# Patient Record
Sex: Male | Born: 1945 | Race: White | Hispanic: No | Marital: Married | Smoking: Never smoker
Health system: Southern US, Community
[De-identification: ages and names within clinical notes are randomized; demographics above are authoritative.]

## PROBLEM LIST (undated history)

## (undated) DIAGNOSIS — E7800 Pure hypercholesterolemia, unspecified: Secondary | ICD-10-CM

## (undated) DIAGNOSIS — I4891 Unspecified atrial fibrillation: Secondary | ICD-10-CM

## (undated) DIAGNOSIS — I10 Essential (primary) hypertension: Secondary | ICD-10-CM

## (undated) HISTORY — PX: REPLACEMENT TOTAL KNEE BILATERAL: SUR1225

---

## 2017-07-21 ENCOUNTER — Emergency Department (HOSPITAL_COMMUNITY)
Admission: EM | Admit: 2017-07-21 | Discharge: 2017-07-22 | Disposition: A | Discharge status: Home / Self Care | Payer: Medicare Other | Attending: Emergency Medicine | Admitting: Emergency Medicine

## 2017-07-21 ENCOUNTER — Emergency Department (HOSPITAL_COMMUNITY): Payer: Medicare Other

## 2017-07-21 ENCOUNTER — Encounter (HOSPITAL_COMMUNITY): Payer: Self-pay | Admitting: Emergency Medicine

## 2017-07-21 DIAGNOSIS — Y92511 Restaurant or cafe as the place of occurrence of the external cause: Secondary | ICD-10-CM | POA: Diagnosis not present

## 2017-07-21 DIAGNOSIS — Z79899 Other long term (current) drug therapy: Secondary | ICD-10-CM | POA: Diagnosis not present

## 2017-07-21 DIAGNOSIS — Z96653 Presence of artificial knee joint, bilateral: Secondary | ICD-10-CM | POA: Diagnosis not present

## 2017-07-21 DIAGNOSIS — S299XXA Unspecified injury of thorax, initial encounter: Secondary | ICD-10-CM | POA: Diagnosis present

## 2017-07-21 DIAGNOSIS — I10 Essential (primary) hypertension: Secondary | ICD-10-CM | POA: Diagnosis not present

## 2017-07-21 DIAGNOSIS — N179 Acute kidney failure, unspecified: Secondary | ICD-10-CM | POA: Insufficient documentation

## 2017-07-21 DIAGNOSIS — Y9301 Activity, walking, marching and hiking: Secondary | ICD-10-CM | POA: Insufficient documentation

## 2017-07-21 DIAGNOSIS — W108XXA Fall (on) (from) other stairs and steps, initial encounter: Secondary | ICD-10-CM | POA: Diagnosis not present

## 2017-07-21 DIAGNOSIS — S2241XA Multiple fractures of ribs, right side, initial encounter for closed fracture: Secondary | ICD-10-CM | POA: Diagnosis not present

## 2017-07-21 DIAGNOSIS — Y999 Unspecified external cause status: Secondary | ICD-10-CM | POA: Insufficient documentation

## 2017-07-21 HISTORY — DX: Pure hypercholesterolemia, unspecified: E78.00

## 2017-07-21 HISTORY — DX: Unspecified atrial fibrillation: I48.91

## 2017-07-21 HISTORY — DX: Essential (primary) hypertension: I10

## 2017-07-21 LAB — CBC WITH DIFFERENTIAL/PLATELET
BASOS PCT: 0 %
Basophils Absolute: 0 10*3/uL (ref 0.0–0.1)
EOS ABS: 0 10*3/uL (ref 0.0–0.7)
Eosinophils Relative: 0 %
HCT: 38.5 % — ABNORMAL LOW (ref 39.0–52.0)
Hemoglobin: 12.8 g/dL — ABNORMAL LOW (ref 13.0–17.0)
Lymphocytes Relative: 8 %
Lymphs Abs: 0.9 10*3/uL (ref 0.7–4.0)
MCH: 32.8 pg (ref 26.0–34.0)
MCHC: 33.2 g/dL (ref 30.0–36.0)
MCV: 98.7 fL (ref 78.0–100.0)
MONOS PCT: 12 %
Monocytes Absolute: 1.4 10*3/uL — ABNORMAL HIGH (ref 0.1–1.0)
NEUTROS PCT: 80 %
Neutro Abs: 8.9 10*3/uL — ABNORMAL HIGH (ref 1.7–7.7)
Platelets: 209 10*3/uL (ref 150–400)
RBC: 3.9 MIL/uL — ABNORMAL LOW (ref 4.22–5.81)
RDW: 13.9 % (ref 11.5–15.5)
WBC: 11.2 10*3/uL — ABNORMAL HIGH (ref 4.0–10.5)

## 2017-07-21 MED ORDER — ONDANSETRON 4 MG PO TBDP
4.0000 mg | ORAL_TABLET | Freq: Once | ORAL | Status: DC
Start: 2017-07-21 — End: 2017-07-21
  Filled 2017-07-21: qty 1

## 2017-07-21 MED ORDER — OXYCODONE HCL 5 MG PO TABS
5.0000 mg | ORAL_TABLET | Freq: Once | ORAL | Status: AC
  Administered 2017-07-21: 5 mg via ORAL
  Filled 2017-07-21: qty 1

## 2017-07-21 MED ORDER — ACETAMINOPHEN 500 MG PO TABS
1000.0000 mg | ORAL_TABLET | Freq: Once | ORAL | Status: AC
  Administered 2017-07-21: 1000 mg via ORAL
  Filled 2017-07-21: qty 2

## 2017-07-21 MED ORDER — ONDANSETRON HCL 4 MG/2ML IJ SOLN
4.0000 mg | Freq: Once | INTRAMUSCULAR | Status: AC
  Administered 2017-07-21: 4 mg via INTRAVENOUS
  Filled 2017-07-21: qty 2

## 2017-07-21 MED ORDER — MORPHINE SULFATE (PF) 4 MG/ML IV SOLN
4.0000 mg | Freq: Once | INTRAVENOUS | Status: AC
  Administered 2017-07-21: 4 mg via INTRAMUSCULAR
  Filled 2017-07-21: qty 1

## 2017-07-21 NOTE — ED Triage Notes (Signed)
PATIENT ADDS PAIN WITH RIGHT RIBS THAT IS WORSE WITH MOVEMENT OR DEEP BREATHING.

## 2017-07-21 NOTE — ED Triage Notes (Signed)
Patient brought in by [facility] from [place] where [place] Patient fell last night on uneven ground. Denies hitting his head or any LOC. Patient c/o is on Eliquis. C/o right flank pain and right knee pain. Patient has abrasion to right knee, with increased pain with movement.

## 2017-07-21 NOTE — ED Provider Notes (Signed)
[facility]-EMERGENCY DEPT Provider Note   CSN: [id number] Arrival date & time: 07/21/17  1107     History   Chief Complaint Chief Complaint  Patient presents with  . Flank Pain  . Knee Pain    right  . Chest Pain    right    HPI Kevin Mooney is a 71 y.o. male.  HPI   Presents with concern for fall with right flank pain and right knee pain.  Was walking on grass to a restaurant, stepping up a step, caught foot on it and fell. Fell onto cement on right side.  No head trauma, no headache, no neck pain, no numbness or weakness on one side or the other.  Severe pain with deep breaths on the right side, 8/10, worse with breathing. Diffculty breathing with deep breaths.  No abdominal pain, no nausea, no vomiting.  Feeling fatigued, weak.  Not lightheaded. Is on eliquis for atrial fibrillation.      Past Medical History:  Diagnosis Date  . Atrial fibrillation (HCC)   . High cholesterol   . Hypertension     There are no active problems to display for this patient.   Past Surgical History:  Procedure Laterality Date  . REPLACEMENT TOTAL KNEE BILATERAL         Home Medications    Prior to Admission medications   Medication Sig Start Date End Date Taking? Authorizing Provider  amiodarone (PACERONE) 200 MG tablet Take 200 mg by mouth 2 (two) times daily. 07/06/17  Yes [provider]  amLODipine (NORVASC) 5 MG tablet Take 5 mg by mouth daily. 05/03/17  Yes [provider]  benazepril-hydrochlorthiazide (LOTENSIN HCT) 20-25 MG tablet Take 1 tablet by mouth daily. 06/25/17  Yes [provider]  doxazosin (CARDURA) 4 MG tablet Take 4 mg by mouth 2 (two) times daily. 06/02/17  Yes [provider]  ELIQUIS 5 MG TABS tablet Take 5 mg by mouth 2 (two) times daily. 06/25/17  Yes [provider]  metoprolol succinate (TOPROL-XL) 50 MG 24 hr tablet Take 50 mg by mouth 2 (two) times daily. 05/16/17  Yes [provider]  Multiple Vitamin  (MULTIVITAMIN WITH MINERALS) TABS tablet Take 1 tablet by mouth daily.   Yes [provider]  polyvinyl alcohol (LIQUIFILM TEARS) 1.4 % ophthalmic solution Place 1 drop into both eyes as needed for dry eyes.   Yes [provider]  potassium chloride (K-DUR,KLOR-CON) 10 MEQ tablet Take 10 mEq by mouth daily. 04/13/17  Yes [provider]  pravastatin (PRAVACHOL) 40 MG tablet Take 40 mg by mouth daily. 06/25/17  Yes [provider]  sertraline (ZOLOFT) 50 MG tablet Take 50 mg by mouth daily. 05/26/17  Yes [provider]  zolpidem (AMBIEN) 10 MG tablet Take 10 mg by mouth at bedtime as needed. 07/01/17  Yes [provider]  acetaminophen (TYLENOL) 500 MG tablet Take 2 tablets (1,000 mg total) by mouth every 6 (six) hours as needed. 07/22/17   Alvira Monday, MD  oxyCODONE (ROXICODONE) 5 MG immediate release tablet Take 1 tablet (5 mg total) by mouth every 4 (four) hours as needed for severe pain. 07/22/17   Alvira Monday, MD  potassium chloride (K-DUR) 10 MEQ tablet Take 1 tablet (10 mEq total) by mouth 2 (two) times daily. 07/22/17 07/25/17  Alvira Monday, MD    Family History No family history on file.  Social History Social History  Substance Use Topics  . Smoking status: Never Smoker  .  Smokeless tobacco: Never Used  . Alcohol use No     Allergies   Patient has no known allergies.   Review of Systems Review of Systems  Constitutional: Negative for fever.  Respiratory: Positive for shortness of breath. Negative for cough.   Cardiovascular: Positive for chest pain.  Gastrointestinal: Negative for abdominal pain, nausea and vomiting.  Genitourinary: Positive for flank pain.  Musculoskeletal: Positive for arthralgias.  Neurological: Positive for light-headedness.     Physical Exam Updated Vital Signs BP (!) 155/77 (BP Location: Left Arm)   Pulse (!) 51   Temp 98.3 F (36.8 C) (Oral)   Resp 13   Ht  (1.88 m)   Wt  111.6 kg (246 lb)   SpO2 93%   BMI 31.58 kg/m   Physical Exam  Constitutional: He is oriented to person, place, and time. He appears well-developed and well-nourished. No distress.  HENT:  Head: Normocephalic and atraumatic.  Eyes: Conjunctivae and EOM are normal.  Neck: Normal range of motion.  Cardiovascular: Normal rate, regular rhythm, normal heart sounds and intact distal pulses.  Exam reveals no gallop and no friction rub.   No murmur heard. Pulmonary/Chest: Effort normal and breath sounds normal. No respiratory distress. He has no wheezes. He has no rales. He exhibits tenderness (right).  Abdominal: Soft. He exhibits no distension. There is no tenderness. There is no guarding.  Contusion left abdomen (from prior fall per family)   Musculoskeletal: He exhibits edema (2+ bilaterally).       Cervical back: He exhibits no bony tenderness.  Abrasion, tenderness right knee. Full ROM, no effusion  Neurological: He is alert and oriented to person, place, and time.  Skin: Skin is warm and dry. He is not diaphoretic.  Nursing note and vitals reviewed.    ED Treatments / Results  Labs (all labs ordered are listed, but only abnormal results are displayed) Labs Reviewed  CBC WITH DIFFERENTIAL/PLATELET - Abnormal; Notable for the following:       Result Value   WBC 11.2 (*)    RBC 3.90 (*)    Hemoglobin 12.8 (*)    HCT 38.5 (*)    Neutro Abs 8.9 (*)    Monocytes Absolute 1.4 (*)    All other components within normal limits  COMPREHENSIVE METABOLIC PANEL - Abnormal; Notable for the following:    Potassium 3.0 (*)    Glucose, Bld 116 (*)    BUN 40 (*)    Creatinine, Ser 1.76 (*)    Calcium 8.7 (*)    Total Protein 6.2 (*)    AST 66 (*)    ALT 108 (*)    GFR calc non Af Amer 37 (*)    GFR calc Af Amer 42 (*)    All other components within normal limits    EKG  EKG Interpretation  Date/Time:  Wednesday July 21 2017 21:44:15 EDT Ventricular Rate:  49 PR Interval:      QRS Duration: 116 QT Interval:  473 QTC Calculation: 427 R Axis:   -59 Text Interpretation:  Sinus bradycardia LAD, 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 EXAM: RIGHT RIBS AND CHEST -  3+ VIEW COMPARISON:  None. FINDINGS: Trachea is midline. Heart is mildly enlarged. Lungs are low in volume with streaky atelectasis in the left lower lobe. No airspace consolidation or pleural fluid. No pneumothorax. Dedicated views of the right ribs show nondisplaced fractures of the lateral aspects of the right ninth and tenth ribs. The ninth rib fracture appears to be segmental. IMPRESSION: Fractures of the right ninth and tenth ribs. Electronically Signed   By: Leanna Battles M.D.   On: 07/21/2017 12:14   Dg Knee Complete 4 Views Right  Result Date: 07/21/2017 CLINICAL DATA:  Fall with right knee pain.  Initial encounter. EXAM: RIGHT KNEE - COMPLETE 4+ VIEW COMPARISON:  None. FINDINGS: No evidence of fracture, dislocation, or joint effusion. Total knee arthroplasty that appears well seated. IMPRESSION: 1. No acute finding. 2. Well-seated total knee arthroplasty. Electronically Signed   By: Marnee Spring M.D.   On: 07/21/2017 12:12    Procedures Procedures (including critical care time)  Medications Ordered in ED Medications  morphine 4 MG/ML injection 4 mg (4 mg Intramuscular Given 07/21/17 2201)  ondansetron (ZOFRAN) injection 4 mg (4 mg Intravenous Given 07/21/17 2201)  oxyCODONE (Oxy IR/ROXICODONE) immediate release tablet 5 mg (5 mg Oral Given 07/21/17 2341)  acetaminophen (TYLENOL) tablet 1,000 mg (1,000 mg Oral Given 07/21/17 2341)   oxyCODONE (Oxy IR/ROXICODONE) immediate release tablet 5 mg (5 mg Oral Given 07/21/17 2352)  potassium chloride SA (K-DUR,KLOR-CON) CR tablet 40 mEq (40 mEq Oral Given 07/22/17 0150)     Initial Impression / Assessment and Plan / ED Course  I have reviewed the triage vital signs and the nursing notes.  Pertinent labs & imaging results that were available during my care of the patient were reviewed by me and considered in my medical decision making (see chart for details).     71yo male with history of atrial fibrillation, htn, hlpd, presents with concern for fall last night with right rib pain and right knee pain.  Denies neck pain, CSpine tenderness, neurologically intact, doubt cervical spine injury.  Discussed given pt on eliquis it is reasonable to obtain head CT, he did not hit his head, no LOC, no signs of head trauma, no headache, no nausea/vomiting, no confusion and feel it is reasonable to continue to monitor his symptoms and patient agrees. Knee xr WNL, patellar tendon function intact, likely abrasion/contusion pain.  XR of ribs shows 9th and 10th rib fractures. Discussed that CT was reasonable to assess for other injuries occult on XR, but given incident occurred last night, patient performing well on spirometry, stable vital signs, no pneumo/hemothorax on XR feel it will likely not change management and patient is stable for outpatient management with strict return precautions.    He is pulling 2000cc on IS, pain managed with oral medications.  Labs obtained given he did describe some lightheadedness, showing hgb stable for days ago. AST and ALT similar to prior and doubt acute hepatic injury from fall.  Cr noted to be increased to 1.7, previous was 1.2-1.5.  Feel this is likely secondary to dehydration, patient not eating or drinking a lot today.  Given K replacement for K of 3.  Encouraged po intake, IS, close PCP follow up.  Final Clinical Impressions(s) / ED Diagnoses   Final  diagnoses:  Closed fracture of multiple ribs of right side, initial encounter  Acute kidney injury (HCC), mild elevation in creatinine to 1.7    New Prescriptions Discharge Medication List as of 07/22/2017  1:44 AM  START taking these medications   Details  acetaminophen (TYLENOL) 500 MG tablet Take 2 tablets (1,000 mg total) by mouth every 6 (six) hours as needed., Starting Thu 07/22/2017, Print    oxyCODONE (ROXICODONE) 5 MG immediate release tablet Take 1 tablet (5 mg total) by mouth every 4 (four) hours as needed for severe pain., Starting Thu 07/22/2017, Print    potassium chloride (K-DUR) 10 MEQ tablet Take 1 tablet (10 mEq total) by mouth 2 (two) times daily., Starting Thu 07/22/2017, Until Sun 07/25/2017, Print         Alvira Monday, MD 07/22/17 1341

## 2017-07-21 NOTE — ED Notes (Signed)
Bed: WTR6 Expected date: 07/21/17 Expected time: 11:10 AM Means of arrival: Ambulance Comments: Fall, knee pain

## 2017-07-21 NOTE — ED Notes (Signed)
Dropped pain med on floor in front of pt Pain med wasted with 2nd rn  Provider notified and pain med reordered  IS given to pt 10 breathes every hr to start  Pain meds given  Pain 7 out 10

## 2017-07-22 DIAGNOSIS — S2241XA Multiple fractures of ribs, right side, initial encounter for closed fracture: Secondary | ICD-10-CM | POA: Diagnosis not present

## 2017-07-22 LAB — COMPREHENSIVE METABOLIC PANEL
ALBUMIN: 3.5 g/dL (ref 3.5–5.0)
ALT: 108 U/L — AB (ref 17–63)
AST: 66 U/L — AB (ref 15–41)
Alkaline Phosphatase: 61 U/L (ref 38–126)
Anion gap: 8 (ref 5–15)
BUN: 40 mg/dL — AB (ref 6–20)
CHLORIDE: 104 mmol/L (ref 101–111)
CO2: 30 mmol/L (ref 22–32)
Calcium: 8.7 mg/dL — ABNORMAL LOW (ref 8.9–10.3)
Creatinine, Ser: 1.76 mg/dL — ABNORMAL HIGH (ref 0.61–1.24)
GFR calc Af Amer: 42 mL/min — ABNORMAL LOW (ref >60)
GFR calc non Af Amer: 37 mL/min — ABNORMAL LOW (ref >60)
GLUCOSE: 116 mg/dL — AB (ref 65–99)
POTASSIUM: 3 mmol/L — AB (ref 3.5–5.1)
Sodium: 142 mmol/L (ref 135–145)
Total Bilirubin: 0.7 mg/dL (ref 0.3–1.2)
Total Protein: 6.2 g/dL — ABNORMAL LOW (ref 6.5–8.1)

## 2017-07-22 MED ORDER — POTASSIUM CHLORIDE ER 10 MEQ PO TBCR: 10.0000 meq | EXTENDED_RELEASE_TABLET | Freq: Two times a day (BID) | ORAL | 0 refills | Status: AC

## 2017-07-22 MED ORDER — ACETAMINOPHEN 500 MG PO TABS: 1000.0000 mg | ORAL_TABLET | Freq: Four times a day (QID) | ORAL | 0 refills | Status: AC | PRN

## 2017-07-22 MED ORDER — POTASSIUM CHLORIDE CRYS ER 20 MEQ PO TBCR
40.0000 meq | EXTENDED_RELEASE_TABLET | Freq: Once | ORAL | Status: AC
  Administered 2017-07-22: 40 meq via ORAL
  Filled 2017-07-22: qty 2

## 2017-07-22 MED ORDER — OXYCODONE HCL 5 MG PO TABS: 5.0000 mg | ORAL_TABLET | ORAL | 0 refills | Status: AC | PRN

## 2018-06-13 IMAGING — CR DG RIBS W/ CHEST 3+V*R*
5 series · 5 of 5 positions shown · non-contrast
Comparison: None.

CLINICAL DATA: Fall, right flank and rib pain, initial encounter.

EXAM:
RIGHT RIBS AND CHEST - 3+ VIEW

[w chest pa]
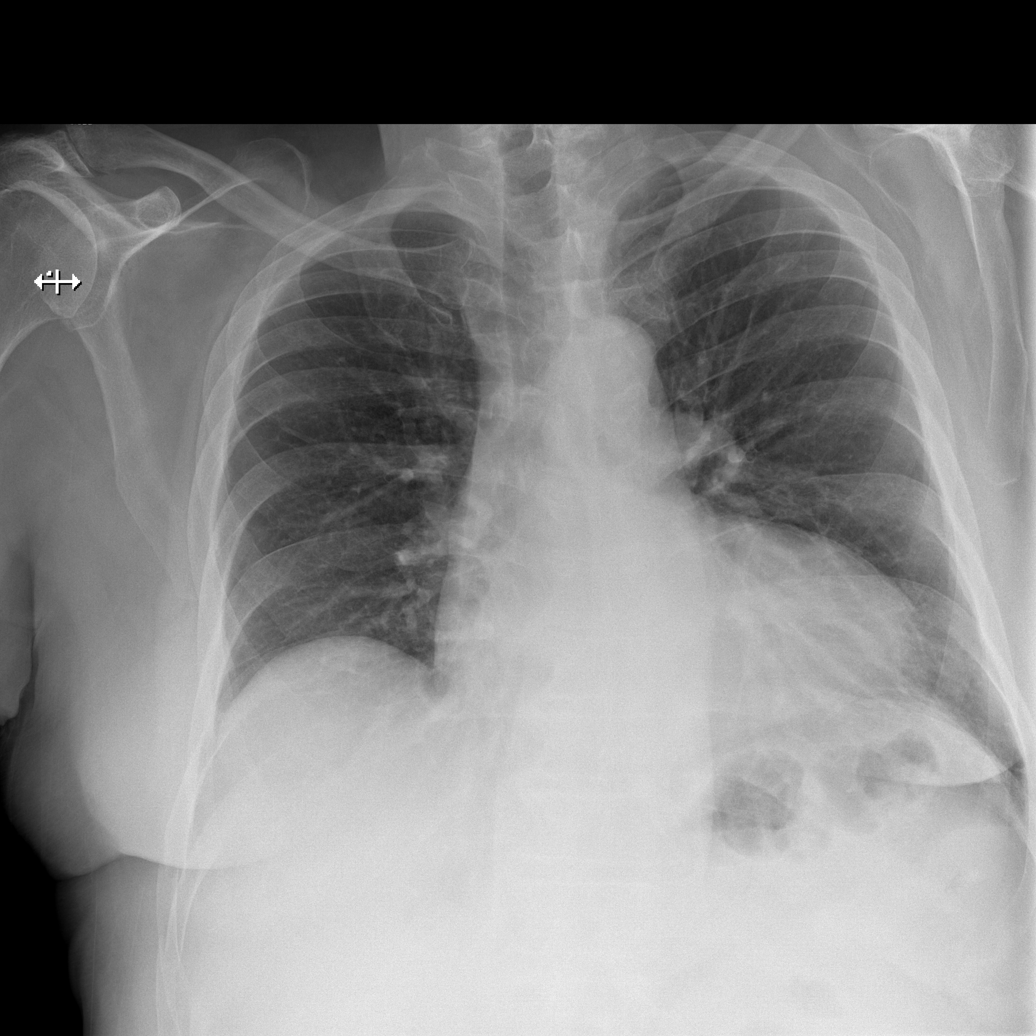

[w ribs ap upper right]
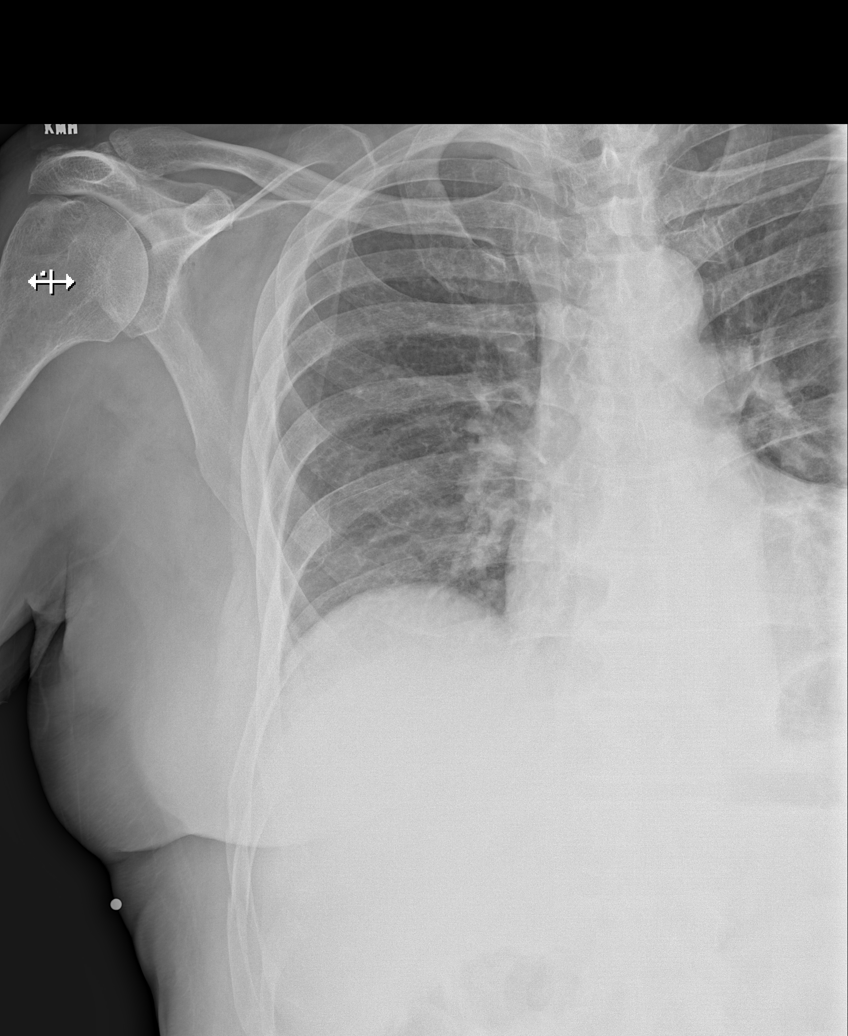

[w ribs ap lower right]
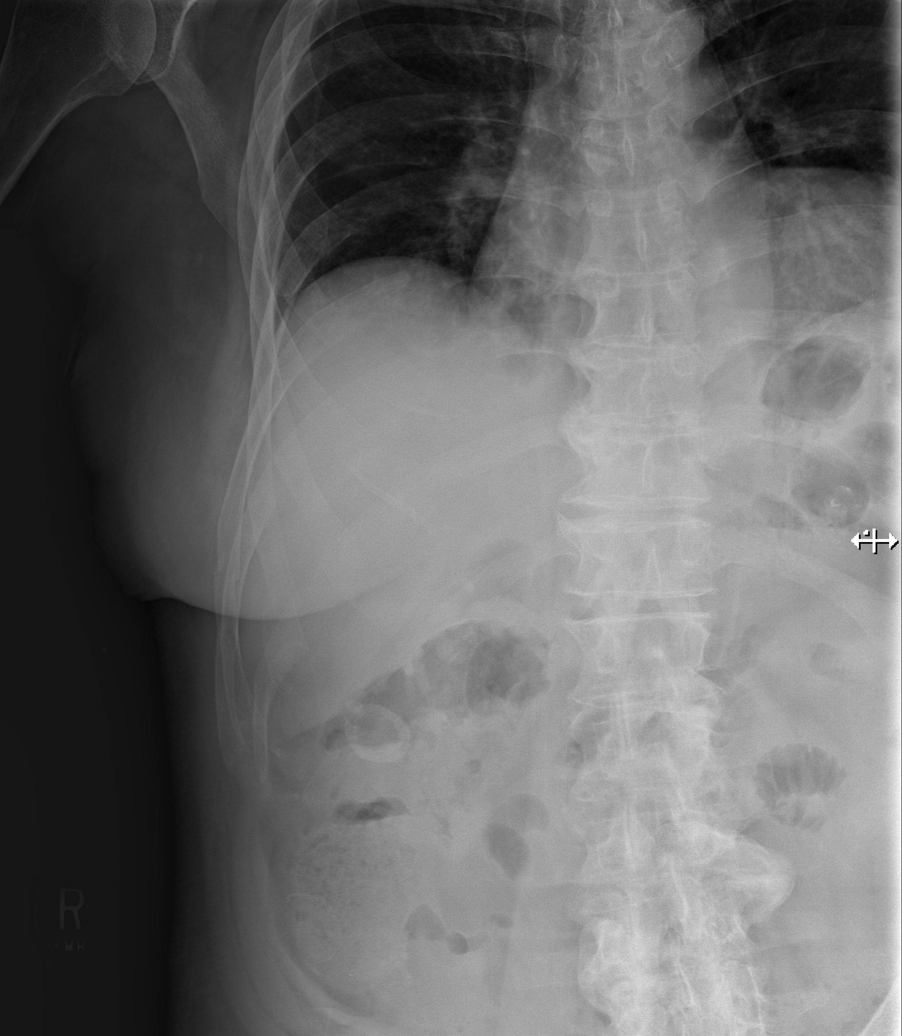

[w ribs obl right (1 of 2)]
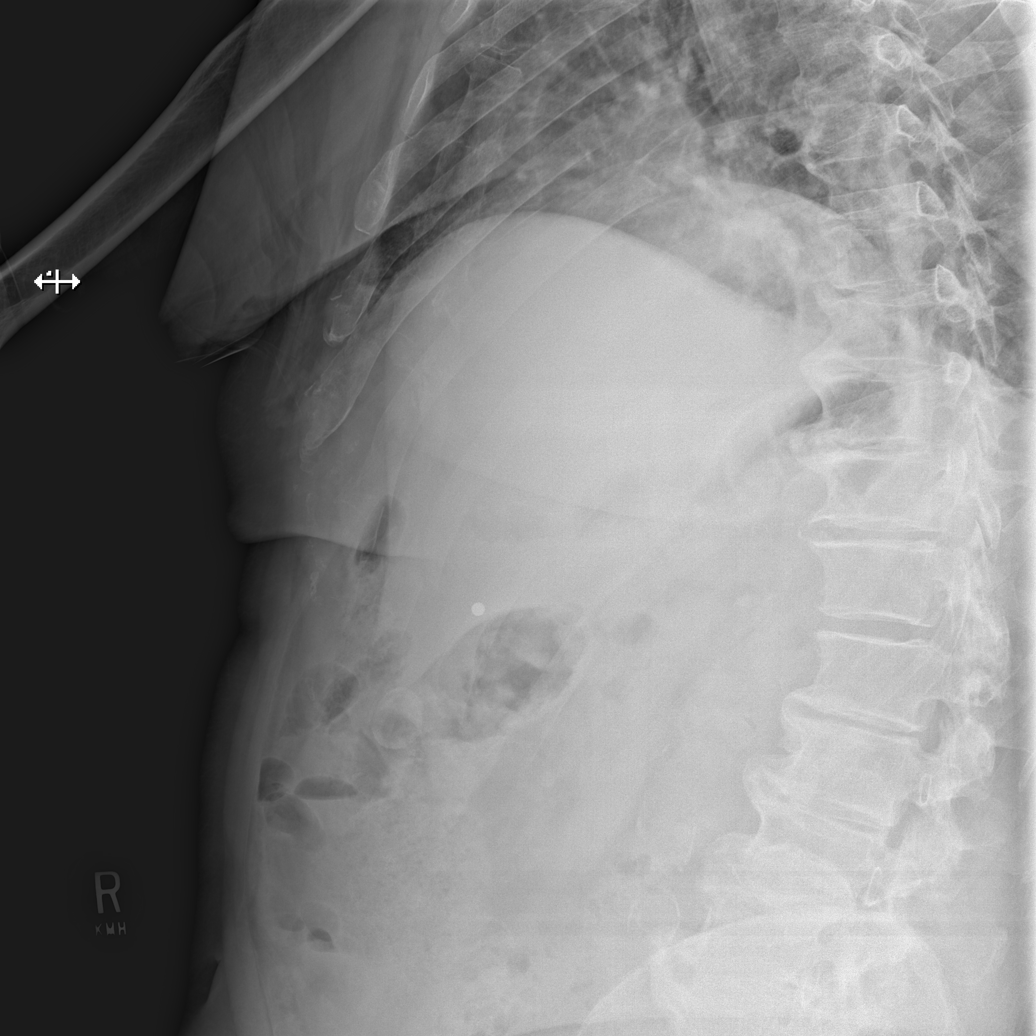

[w ribs obl right (2 of 2)]
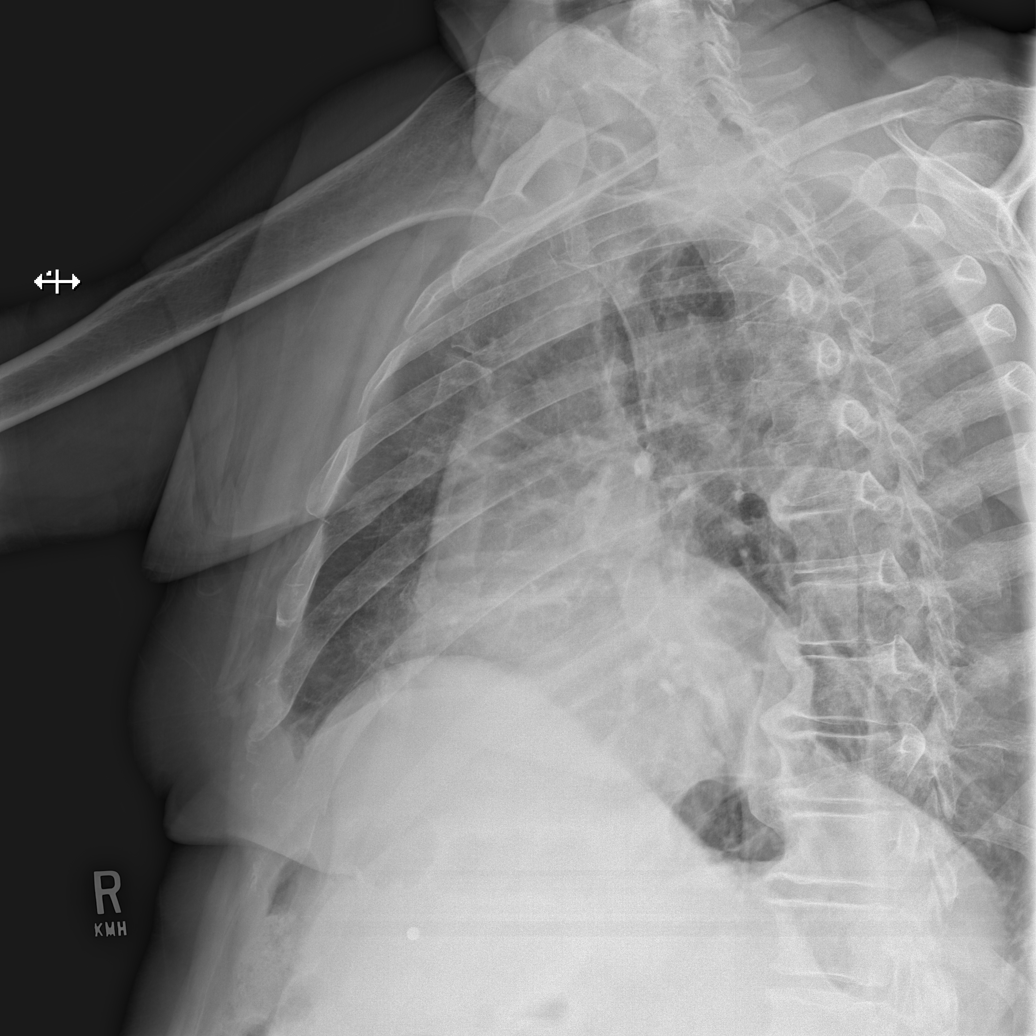

[5 of 5 positions shown; findings below may reference images not displayed]

FINDINGS: Trachea is midline. Heart is mildly enlarged. Lungs are low in
volume with streaky atelectasis in the left lower lobe. No airspace
consolidation or pleural fluid. No pneumothorax.

Dedicated views of the right ribs show nondisplaced fractures of the
lateral aspects of the right ninth and tenth ribs. The ninth rib
fracture appears to be segmental.
IMPRESSION: Fractures of the right ninth and tenth ribs.
